# Patient Record
Sex: Female | Born: 1975
Health system: Southern US, Community
[De-identification: ages and names within clinical notes are randomized; demographics above are authoritative.]

## PROBLEM LIST (undated history)

## (undated) DIAGNOSIS — E1169 Type 2 diabetes mellitus with other specified complication: Secondary | ICD-10-CM

## (undated) HISTORY — DX: Morbid (severe) obesity due to excess calories: E66.01

## (undated) HISTORY — DX: Type 2 diabetes mellitus with other specified complication: E11.69

---

## 1999-11-14 ENCOUNTER — Other Ambulatory Visit: Admission: RE | Admit: 1999-11-14 | Discharge: 1999-11-14 | Payer: Self-pay | Admitting: Obstetrics and Gynecology

## 2000-06-09 ENCOUNTER — Other Ambulatory Visit: Admission: RE | Admit: 2000-06-09 | Discharge: 2000-06-09 | Payer: Self-pay | Admitting: Obstetrics and Gynecology

## 2000-12-04 ENCOUNTER — Other Ambulatory Visit: Admission: RE | Admit: 2000-12-04 | Discharge: 2000-12-04 | Payer: Self-pay | Admitting: Obstetrics and Gynecology

## 2001-05-21 ENCOUNTER — Encounter: Payer: Self-pay | Admitting: Obstetrics and Gynecology

## 2001-05-21 ENCOUNTER — Encounter: Admission: RE | Admit: 2001-05-21 | Discharge: 2001-05-21 | Payer: Self-pay | Admitting: Obstetrics and Gynecology

## 2003-09-23 ENCOUNTER — Inpatient Hospital Stay (HOSPITAL_COMMUNITY): Admission: RE | Admit: 2003-09-23 | Discharge: 2003-09-23 | Payer: Self-pay | Admitting: Gynecology

## 2003-09-25 ENCOUNTER — Inpatient Hospital Stay (HOSPITAL_COMMUNITY): Admission: AD | Admit: 2003-09-25 | Discharge: 2003-09-25 | Payer: Self-pay | Admitting: Gynecology

## 2004-05-14 ENCOUNTER — Other Ambulatory Visit: Admission: RE | Admit: 2004-05-14 | Discharge: 2004-05-14 | Payer: Self-pay | Admitting: Gynecology

## 2004-05-24 ENCOUNTER — Encounter: Admission: RE | Admit: 2004-05-24 | Discharge: 2004-05-24 | Payer: Self-pay | Admitting: Gynecology

## 2004-06-14 ENCOUNTER — Emergency Department: Payer: Self-pay | Admitting: Emergency Medicine

## 2004-07-01 ENCOUNTER — Ambulatory Visit (HOSPITAL_COMMUNITY): Admission: RE | Admit: 2004-07-01 | Discharge: 2004-07-01 | Payer: Self-pay | Admitting: Gynecology

## 2004-07-17 ENCOUNTER — Ambulatory Visit (HOSPITAL_COMMUNITY): Admission: RE | Admit: 2004-07-17 | Discharge: 2004-07-17 | Payer: Self-pay | Admitting: Gynecology

## 2004-07-22 ENCOUNTER — Encounter: Admission: RE | Admit: 2004-07-22 | Discharge: 2004-07-22 | Payer: Self-pay | Admitting: Gynecology

## 2004-09-19 ENCOUNTER — Inpatient Hospital Stay (HOSPITAL_COMMUNITY): Admission: AD | Admit: 2004-09-19 | Discharge: 2004-09-21 | Payer: Self-pay | Admitting: Gynecology

## 2004-09-19 ENCOUNTER — Inpatient Hospital Stay (HOSPITAL_COMMUNITY): Admission: AD | Admit: 2004-09-19 | Discharge: 2004-09-19 | Payer: Self-pay | Admitting: Gynecology

## 2004-09-20 ENCOUNTER — Encounter (INDEPENDENT_AMBULATORY_CARE_PROVIDER_SITE_OTHER): Payer: Self-pay | Admitting: *Deleted

## 2004-10-31 ENCOUNTER — Other Ambulatory Visit: Admission: RE | Admit: 2004-10-31 | Discharge: 2004-10-31 | Payer: Self-pay | Admitting: Gynecology

## 2005-11-03 ENCOUNTER — Other Ambulatory Visit: Admission: RE | Admit: 2005-11-03 | Discharge: 2005-11-03 | Payer: Self-pay | Admitting: Gynecology

## 2007-02-10 ENCOUNTER — Other Ambulatory Visit: Admission: RE | Admit: 2007-02-10 | Discharge: 2007-02-10 | Payer: Self-pay | Admitting: Gynecology

## 2007-04-26 ENCOUNTER — Encounter: Admission: RE | Admit: 2007-04-26 | Discharge: 2007-04-26 | Payer: Self-pay | Admitting: Family Medicine

## 2008-05-11 ENCOUNTER — Encounter: Payer: Self-pay | Admitting: Gynecology

## 2008-05-11 ENCOUNTER — Other Ambulatory Visit: Admission: RE | Admit: 2008-05-11 | Discharge: 2008-05-11 | Payer: Self-pay | Admitting: Gynecology

## 2008-05-11 ENCOUNTER — Ambulatory Visit: Payer: Self-pay | Admitting: Gynecology

## 2009-12-14 ENCOUNTER — Other Ambulatory Visit: Admission: RE | Admit: 2009-12-14 | Discharge: 2009-12-14 | Payer: Self-pay | Admitting: Family Medicine

## 2009-12-17 ENCOUNTER — Ambulatory Visit: Payer: Self-pay | Admitting: Gynecology

## 2009-12-24 ENCOUNTER — Ambulatory Visit: Payer: Self-pay | Admitting: Gynecology

## 2010-02-26 ENCOUNTER — Ambulatory Visit (HOSPITAL_COMMUNITY): Admission: RE | Admit: 2010-02-26 | Discharge: 2010-02-26 | Payer: Self-pay | Admitting: Obstetrics & Gynecology

## 2010-03-27 ENCOUNTER — Ambulatory Visit (HOSPITAL_COMMUNITY): Admission: RE | Admit: 2010-03-27 | Discharge: 2010-03-27 | Payer: Self-pay | Admitting: Obstetrics & Gynecology

## 2010-04-24 ENCOUNTER — Ambulatory Visit (HOSPITAL_COMMUNITY): Admission: RE | Admit: 2010-04-24 | Discharge: 2010-04-24 | Payer: Self-pay | Admitting: Obstetrics & Gynecology

## 2010-05-08 ENCOUNTER — Encounter
Admission: RE | Admit: 2010-05-08 | Discharge: 2010-05-08 | Payer: Self-pay | Source: Home / Self Care | Attending: Obstetrics and Gynecology | Admitting: Obstetrics and Gynecology

## 2010-06-19 ENCOUNTER — Ambulatory Visit (HOSPITAL_COMMUNITY)
Admission: RE | Admit: 2010-06-19 | Discharge: 2010-06-19 | Payer: Self-pay | Source: Home / Self Care | Attending: Obstetrics & Gynecology | Admitting: Obstetrics & Gynecology

## 2010-07-08 ENCOUNTER — Inpatient Hospital Stay (HOSPITAL_COMMUNITY)
Admission: AD | Admit: 2010-07-08 | Discharge: 2010-07-08 | Payer: Self-pay | Source: Home / Self Care | Attending: Obstetrics and Gynecology | Admitting: Obstetrics and Gynecology

## 2010-07-17 ENCOUNTER — Ambulatory Visit (HOSPITAL_COMMUNITY)
Admission: RE | Admit: 2010-07-17 | Discharge: 2010-07-17 | Payer: Self-pay | Source: Home / Self Care | Attending: Obstetrics & Gynecology | Admitting: Obstetrics & Gynecology

## 2010-07-18 ENCOUNTER — Inpatient Hospital Stay (HOSPITAL_COMMUNITY)
Admission: AD | Admit: 2010-07-18 | Discharge: 2010-07-21 | Payer: Self-pay | Source: Home / Self Care | Attending: Obstetrics & Gynecology | Admitting: Obstetrics & Gynecology

## 2010-07-18 LAB — CBC
HCT: 34.6 % — ABNORMAL LOW (ref 36.0–46.0)
Hemoglobin: 11.5 g/dL — ABNORMAL LOW (ref 12.0–15.0)
MCH: 28.3 pg (ref 26.0–34.0)
MCHC: 33.2 g/dL (ref 30.0–36.0)
MCV: 85.2 fL (ref 78.0–100.0)
Platelets: 291 10*3/uL (ref 150–400)
RBC: 4.06 MIL/uL (ref 3.87–5.11)
RDW: 15.2 % (ref 11.5–15.5)
WBC: 11.5 10*3/uL — ABNORMAL HIGH (ref 4.0–10.5)

## 2010-07-18 LAB — COMPREHENSIVE METABOLIC PANEL
ALT: 29 U/L (ref 0–35)
AST: 24 U/L (ref 0–37)
Albumin: 2.7 g/dL — ABNORMAL LOW (ref 3.5–5.2)
Alkaline Phosphatase: 99 U/L (ref 39–117)
BUN: 11 mg/dL (ref 6–23)
CO2: 23 mEq/L (ref 19–32)
Calcium: 9.5 mg/dL (ref 8.4–10.5)
Chloride: 109 mEq/L (ref 96–112)
Creatinine, Ser: 0.73 mg/dL (ref 0.4–1.2)
GFR calc Af Amer: >60 mL/min (ref >60)
GFR calc non Af Amer: >60 mL/min (ref >60)
Glucose, Bld: 63 mg/dL — ABNORMAL LOW (ref 70–99)
Potassium: 4.3 mEq/L (ref 3.5–5.1)
Sodium: 139 mEq/L (ref 135–145)
Total Bilirubin: 0.1 mg/dL — ABNORMAL LOW (ref 0.3–1.2)
Total Protein: 6 g/dL (ref 6.0–8.3)

## 2010-07-19 LAB — RPR: RPR Ser Ql: NONREACTIVE

## 2010-07-29 LAB — CBC
HCT: 30.8 % — ABNORMAL LOW (ref 36.0–46.0)
Hemoglobin: 9.9 g/dL — ABNORMAL LOW (ref 12.0–15.0)
MCH: 28.6 pg (ref 26.0–34.0)
MCHC: 32.1 g/dL (ref 30.0–36.0)
MCV: 89 fL (ref 78.0–100.0)
Platelets: 227 10*3/uL (ref 150–400)
RBC: 3.46 MIL/uL — ABNORMAL LOW (ref 3.87–5.11)
RDW: 15.4 % (ref 11.5–15.5)
WBC: 7.6 10*3/uL (ref 4.0–10.5)

## 2010-07-29 LAB — GLUCOSE, CAPILLARY
Glucose-Capillary: 90 mg/dL (ref 70–99)
Glucose-Capillary: 76 mg/dL (ref 70–99)
Glucose-Capillary: 89 mg/dL (ref 70–99)
Glucose-Capillary: 101 mg/dL — ABNORMAL HIGH (ref 70–99)

## 2010-11-29 NOTE — Consult Note (Signed)
NAME:  Annette Simmons, Annette Simmons                        ACCOUNT NO.:  [id number]   MEDICAL RECORD NO.:  [id number]                   PATIENT TYPE:  OUT   LOCATION:  [facility]                                 FACILITY:  [facility]   PHYSICIAN:  Ivor Costa. Farrel Gobble, M.D.              DATE OF BIRTH:  1975-09-24   DATE OF CONSULTATION:  09/23/2003  DATE OF DISCHARGE:                                   CONSULTATION   BRIEF HISTORY:  The patient is a 35 year old G2, P0-0-1-0 with an LMP of  August 08, 2003; estimated gestational age was 6-3/7 weeks.  She called  earlier this morning complaining of approximately one-week history of  vaginal bleeding, noted mostly when she wiped.  There was mild cramping, but  nothing consistent with her period and no flow consistent with her period  either.  The patient denies any pelvic pain.  She states that the bleeding  is mostly mucoid and no heavy bleeding, heavy cramping and no clots.   OBSTETRICAL/GYNECOLOGICAL HISTORY:  Negative for sexually transmitted  diseases.  She had one pregnancy approximately 12 years ago that ended in a  miscarriage as well.  They were attempting pregnancy for approximately one  year, having been on birth control pills prior to this point.  The patient  states that her period had regulated to monthly prior to her conception.  The patient reports no other GYN history.  She has had Pap smears and they  have all been normal.   REASON FOR CONSULTATION:  We had asked the patient to come in for a GYN  ultrasound because of her above history and a suspected nonviable pregnancy.  Her ultrasound showed no intrauterine pregnancy.  There was a right adnexal  mass inferior to the ovary, with some internal blood flow that measured 2.8  x 2.1 x 1.7 cm.  Differential included an ovarian mass versus an ectopic  pregnancy.  There was a small amount of free fluid.  Based on that, the  patient was asked to come to Triage.   TRIAGE COURSE:  She was afebrile.  Her  vitals were stable.  The patient  reports some more abdominal pressure, left greater right.   PHYSICAL EXAMINATION:  GENERAL:  On examination she is a well-appearing  female in no acute distress.  ABDOMEN:  Obese, soft and nontender.  PELVIC:  On speculum examination her cervix was nulliparous.  There was a  small amount of blood in the vault that was slightly mucoid, but no gross  active bleeding.  A manual examination was somewhat limited by habitus.  There was no cervical motion tenderness.  Uterus and adnexa were also  negative.  However, because of her habitus a rectovaginal examination was  performed.  The posterior uterus was also nontender, as were the adnexa.  The fullness was not appreciated.   ASSESSMENT:  Missed AB with corpus luteum versus ectopic pregnancy, without  a gestation.  The ultrasound findings were discussed with the patient.  We  discussed treating her pre-emptively with methotrexate.  The risks and  benefits of that were discussed with her and acceptable.  We then elected to  get the methotrexate labs; at this point her CBC is all that is back.  Her  white count is 7.0.  H&H is 12.9 and 83.  Her platelets are 391.  Her  chemistry panel beta HCG are not available at the time of this dictation.  She is 0 positive.   We will triage her once we have received the full extent of her labs.  We,  again, discussed the benefits of methotrexate therapy.  All questions were  addressed.                                               Ivor Costa. Farrel Gobble, M.D.    THL/MEDQ  D:  09/23/2003  T:  09/23/2003  Job:  [id number]

## 2013-03-11 ENCOUNTER — Encounter: Payer: Self-pay | Admitting: Podiatry

## 2013-03-11 ENCOUNTER — Ambulatory Visit (INDEPENDENT_AMBULATORY_CARE_PROVIDER_SITE_OTHER): Payer: 59 | Admitting: Podiatry

## 2013-03-11 DIAGNOSIS — M21969 Unspecified acquired deformity of unspecified lower leg: Secondary | ICD-10-CM

## 2013-03-11 DIAGNOSIS — M21962 Unspecified acquired deformity of left lower leg: Secondary | ICD-10-CM

## 2013-03-11 DIAGNOSIS — M722 Plantar fascial fibromatosis: Secondary | ICD-10-CM | POA: Insufficient documentation

## 2013-03-11 MED ORDER — DEXAMETHASONE SODIUM PHOSPHATE 10 MG/ML IJ SOLN: 4.0000 mg | Freq: Once | INTRAMUSCULAR | Status: AC

## 2013-03-11 MED ORDER — TRIAMCINOLONE ACETONIDE 40 MG/ML IJ SUSP: 10.0000 mg | Freq: Once | INTRAMUSCULAR | Status: AC

## 2013-03-11 NOTE — Progress Notes (Signed)
Subjective: 37 year old female presents with painful heel. Last seen 2 years ago for the pain on lateral column of left foot. Now the pain is more towards heel, plantar lateral. Been hurting for 3-4 months. Wearing Orthotics, which helps on the previous pain, lateral metatarsal pain. Barefooted makes more pain. Pain is shooting and stabbing type pain. Tries to walk, but problem with the pain. When it gets bad, the pain level is at about 8. Sometimes bare can walk on.  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 Takes a few minutes to get adjusted. Making legs hurt now.  Objective: Cavus type rigid foot bilateral. HAV with bunion L>R. Elevated first ray bilateral L>R. Left lateral heel pain.   Tx. Injection to left heel given with 4 mg Dexamethasone and 10 mg Triamcinolone.  Night Splint for right dispensed. May need orthotics and/more Lapidus fusion. Patient will return for either new orthotics or for surgery.  Will need X-rays on her next visit.

## 2013-04-11 ENCOUNTER — Encounter: Payer: Self-pay | Admitting: Podiatry

## 2013-04-11 ENCOUNTER — Ambulatory Visit (INDEPENDENT_AMBULATORY_CARE_PROVIDER_SITE_OTHER): Payer: 59 | Admitting: Podiatry

## 2013-04-11 VITALS — BP 133/86 | HR 95 | Ht 67.0 in | Wt 360.0 lb

## 2013-04-11 DIAGNOSIS — M21969 Unspecified acquired deformity of unspecified lower leg: Secondary | ICD-10-CM

## 2013-04-11 DIAGNOSIS — M21962 Unspecified acquired deformity of left lower leg: Secondary | ICD-10-CM

## 2013-04-11 DIAGNOSIS — M722 Plantar fascial fibromatosis: Secondary | ICD-10-CM

## 2013-04-11 NOTE — Patient Instructions (Addendum)
Seen for left foot pain. Pre-op discussion done for Endoscopic Plantar fasciotomy left.  Also given Cortisone injection to left heel. Surgery will be scheduled for 04/28/13.

## 2013-04-11 NOTE — Progress Notes (Signed)
Subjective: 37 year old female presents stating that she had good relief for 2 weeks with last injection. Today wants to discuss surgical option on left foot pain. She has two children age 40 and 3.  Patient is 5'7" tall and weigh 360 lbs.   Objective: Cavus type foot with hypermobile first ray left>right. Hallux valgus with bunion left>right. Plantar heel pain left. Normal neurovascular status.   Assessment: Plantar fasciitis left. Hypermobile first ray left. Hallux valgus with bunion left.   Plan: Injection to left heel with 1cc 1% Xylocaine plain and 10mg  Triamcinolone and 5mg  dexamethasone. Both Orthotics adjusted with lateral posting with 1/8" in heel.  EPF left heel discussed. Will be scheduled for 04/28/13. If this fail to improve, may need Lapidus fusion in future.

## 2013-04-28 DIAGNOSIS — M722 Plantar fascial fibromatosis: Secondary | ICD-10-CM

## 2013-04-28 HISTORY — PX: OTHER SURGICAL HISTORY: SHX169

## 2013-05-03 ENCOUNTER — Encounter: Payer: Self-pay | Admitting: Podiatry

## 2013-05-03 ENCOUNTER — Ambulatory Visit (INDEPENDENT_AMBULATORY_CARE_PROVIDER_SITE_OTHER): Payer: 59 | Admitting: Podiatry

## 2013-05-03 VITALS — BP 129/83 | HR 84

## 2013-05-03 DIAGNOSIS — M722 Plantar fascial fibromatosis: Secondary | ICD-10-CM

## 2013-05-03 NOTE — Patient Instructions (Signed)
Post op visit. Left foot splint removed and replaced with CAM walker.  May remove bandage this Friday and wash. Return in 2 weeks.

## 2013-05-03 NOTE — Progress Notes (Signed)
Post op visit. One week status post EPF left. Stated that pain medication made her sick and not taking it. Walking well with Posterior Splint. No having much pain.  Splint removed.  Normal color and bandage intact. Placed back in CAM walker. Ok to wash this Friday.  Return in 2 weeks.

## 2013-05-13 ENCOUNTER — Ambulatory Visit (INDEPENDENT_AMBULATORY_CARE_PROVIDER_SITE_OTHER): Payer: 59 | Admitting: Podiatry

## 2013-05-13 ENCOUNTER — Encounter: Payer: Self-pay | Admitting: Podiatry

## 2013-05-13 DIAGNOSIS — Z9889 Other specified postprocedural states: Secondary | ICD-10-CM | POA: Insufficient documentation

## 2013-05-13 NOTE — Patient Instructions (Signed)
2 weeks post op from EPF. Doing well with CAM walker. Continue in CAM walker for the next 2 weeks. Increase activity as tolerated. 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 Return in 3 week.

## 2013-05-13 NOTE — Progress Notes (Signed)
2 weeks post op from EPF.  Some pain in arch and foot with barefoot. Tender at the heel.  Doing well with CAM walker. Wound is healing well. Suture site is closed off and dry.  Sutures removed.  Continue in CAM walker for the next 2 weeks. Increase activity as tolerated.  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 Return in 3 week.

## 2013-05-25 ENCOUNTER — Telehealth: Payer: Self-pay | Admitting: *Deleted

## 2013-05-25 NOTE — Telephone Encounter (Signed)
05/25/2013 Dr. Raynald Kemp ,Patient called today and has a couple questions. She is still wearing Camwalker but she is having some pain. Does she need to still keep foot elevated, also when she props foot up it isn't straight, she wants to know anything she should do until she comes for her appointment next Friday.

## 2013-06-03 ENCOUNTER — Encounter: Payer: Self-pay | Admitting: Podiatry

## 2013-06-03 ENCOUNTER — Ambulatory Visit (INDEPENDENT_AMBULATORY_CARE_PROVIDER_SITE_OTHER): Payer: 59 | Admitting: Podiatry

## 2013-06-03 DIAGNOSIS — Z9889 Other specified postprocedural states: Secondary | ICD-10-CM

## 2013-06-03 NOTE — Progress Notes (Signed)
Stated that she started to using her regular shoes. Only has some minor discomfort at lateral column of the left foot.  Status post EPF left 5 weeks.   Objective: Hallux valgus with bunion left. Elevated first ray with forefoot varus.  Assessment: Satisfactory progress. Hallux valgus with bunion. Elevated first ray with abnormal biomechanics.  Plan: Reviewed findings. Metatarsal pad may benefit. Dispensed x 1. May also use lateral wedge posting to existing orthotics. Patient is able to do it by herself.  Return as needed.

## 2013-06-03 NOTE — Patient Instructions (Signed)
Status post EPF, doing well. Continue to use Orthotics and return as needed.

## 2014-04-14 ENCOUNTER — Other Ambulatory Visit: Payer: Self-pay | Admitting: Internal Medicine

## 2014-05-02 ENCOUNTER — Other Ambulatory Visit (HOSPITAL_COMMUNITY)
Admission: RE | Admit: 2014-05-02 | Discharge: 2014-05-02 | Disposition: A | Discharge status: Home / Self Care | Payer: 59 | Source: Ambulatory Visit | Attending: Family Medicine | Admitting: Family Medicine

## 2014-05-02 ENCOUNTER — Other Ambulatory Visit: Payer: Self-pay | Admitting: Family Medicine

## 2014-05-02 DIAGNOSIS — Z113 Encounter for screening for infections with a predominantly sexual mode of transmission: Secondary | ICD-10-CM | POA: Diagnosis present

## 2014-05-02 DIAGNOSIS — Z124 Encounter for screening for malignant neoplasm of cervix: Secondary | ICD-10-CM | POA: Diagnosis not present

## 2014-05-04 LAB — CYTOLOGY - PAP

## 2014-05-05 ENCOUNTER — Other Ambulatory Visit: Payer: Self-pay | Admitting: Family Medicine

## 2014-05-05 DIAGNOSIS — T8332XA Displacement of intrauterine contraceptive device, initial encounter: Secondary | ICD-10-CM

## 2014-05-05 DIAGNOSIS — T8389XA Other specified complication of genitourinary prosthetic devices, implants and grafts, initial encounter: Principal | ICD-10-CM

## 2014-05-08 ENCOUNTER — Ambulatory Visit
Admission: RE | Admit: 2014-05-08 | Discharge: 2014-05-08 | Disposition: A | Discharge status: Home / Self Care | Payer: 59 | Source: Ambulatory Visit | Attending: Family Medicine | Admitting: Family Medicine

## 2014-05-08 DIAGNOSIS — T8332XA Displacement of intrauterine contraceptive device, initial encounter: Secondary | ICD-10-CM

## 2014-05-08 DIAGNOSIS — T8389XA Other specified complication of genitourinary prosthetic devices, implants and grafts, initial encounter: Principal | ICD-10-CM

## 2014-06-28 ENCOUNTER — Telehealth: Payer: 59 | Admitting: Family

## 2014-06-28 DIAGNOSIS — J019 Acute sinusitis, unspecified: Secondary | ICD-10-CM

## 2014-06-28 MED ORDER — AMOXICILLIN-POT CLAVULANATE 875-125 MG PO TABS: 1.0000 | ORAL_TABLET | Freq: Two times a day (BID) | ORAL | Status: DC

## 2014-06-28 NOTE — Progress Notes (Signed)

## 2016-01-26 IMAGING — US US PELVIS COMPLETE
1 series · 13 of 25 positions shown · non-contrast
Comparison: None

CLINICAL DATA: Evaluate for intrauterine device migration



[Series 1: us pelvis complete · 0.41mm/px · 13 of 58 slices shown]
[im 1/58]
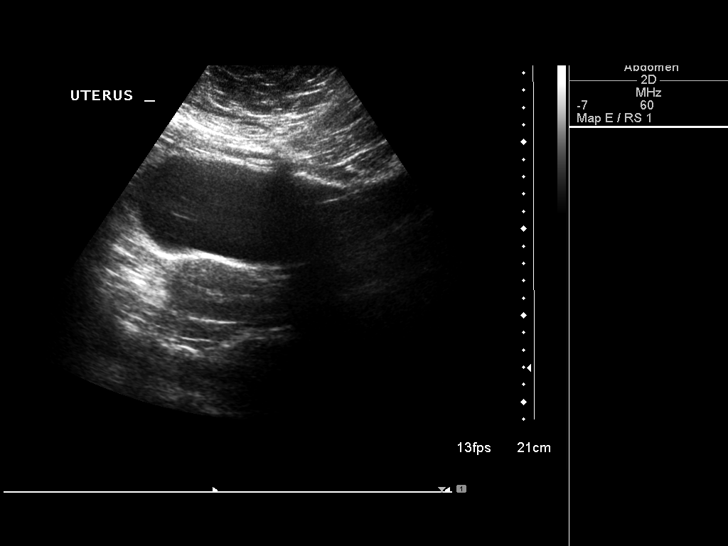
[im 5/58]
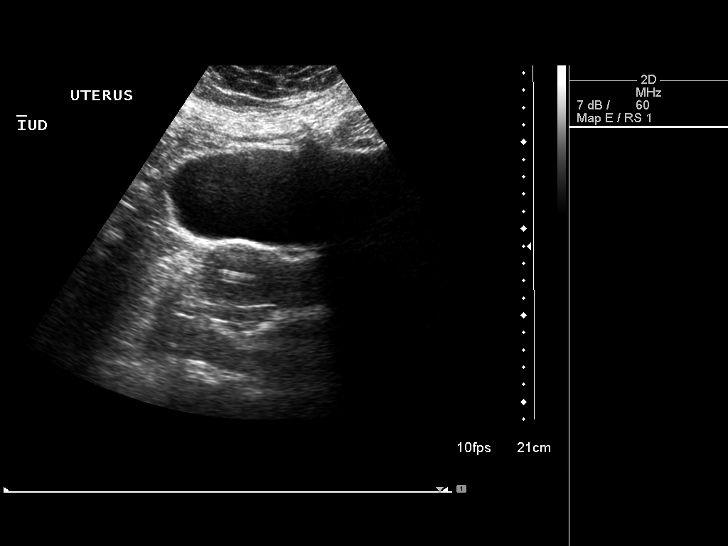
[im 10/58]
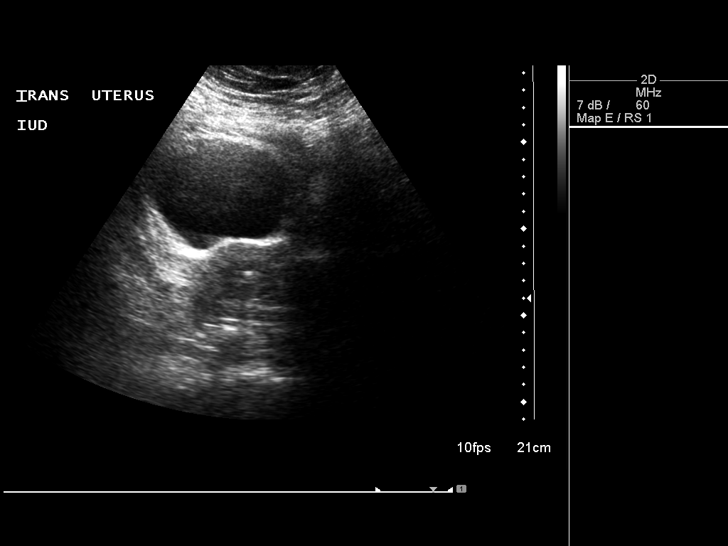
[im 15/58]
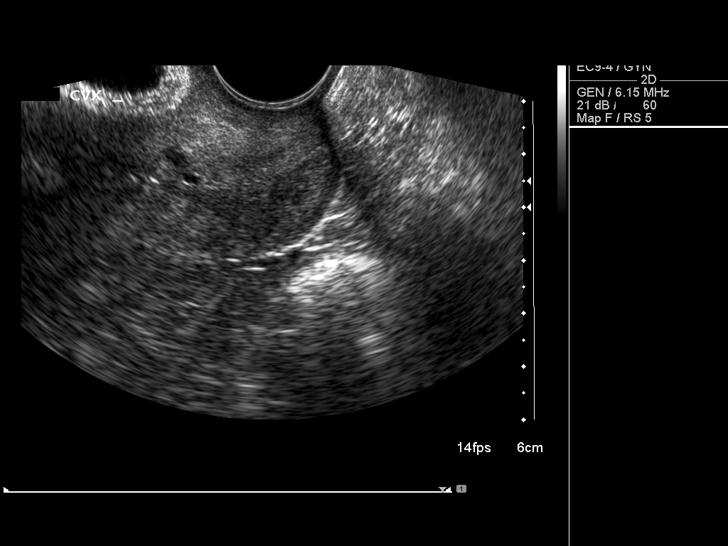
[im 20/58]
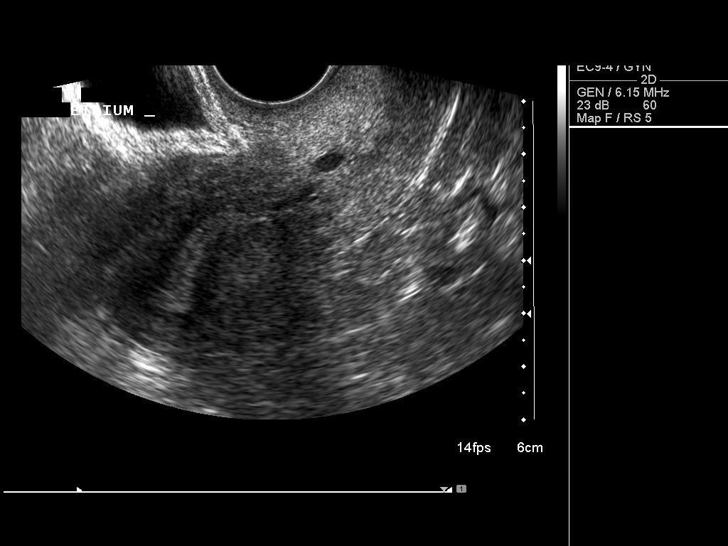
[im 24/58]
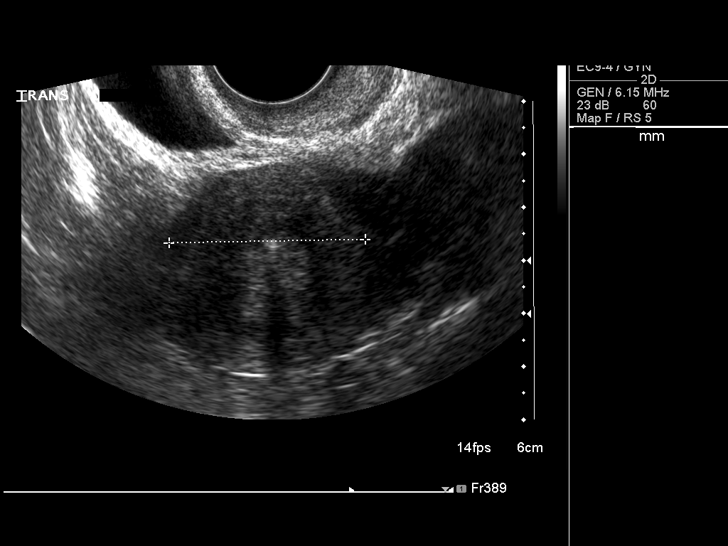
[im 29/58]
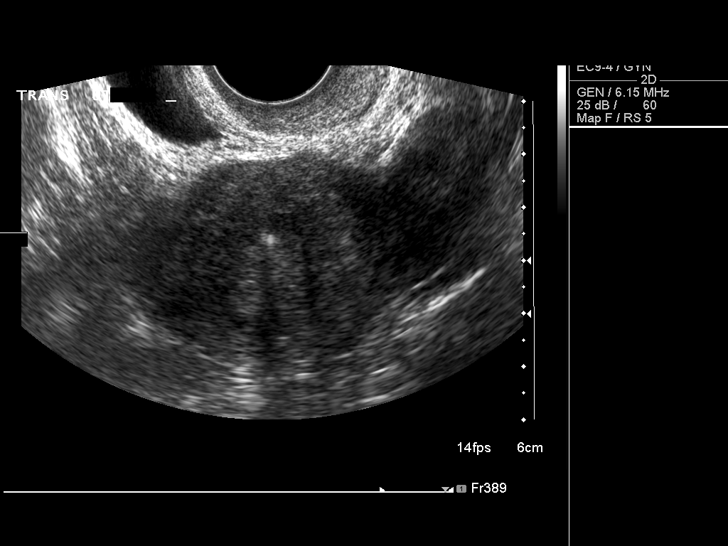
[im 34/58]
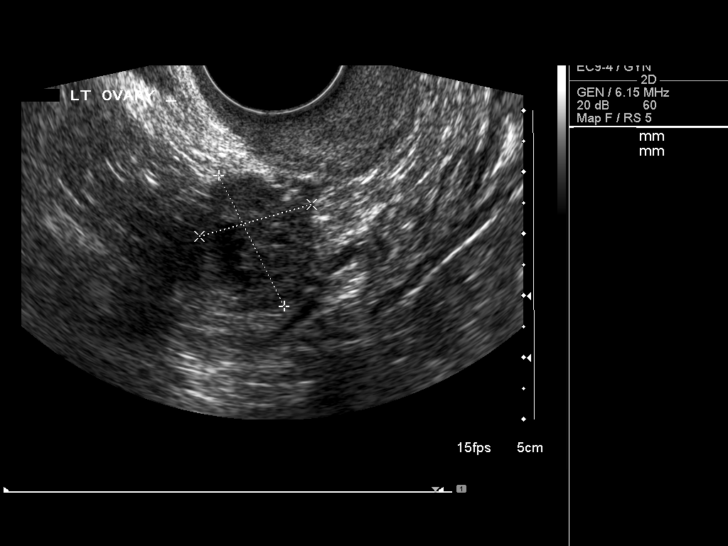
[im 39/58]
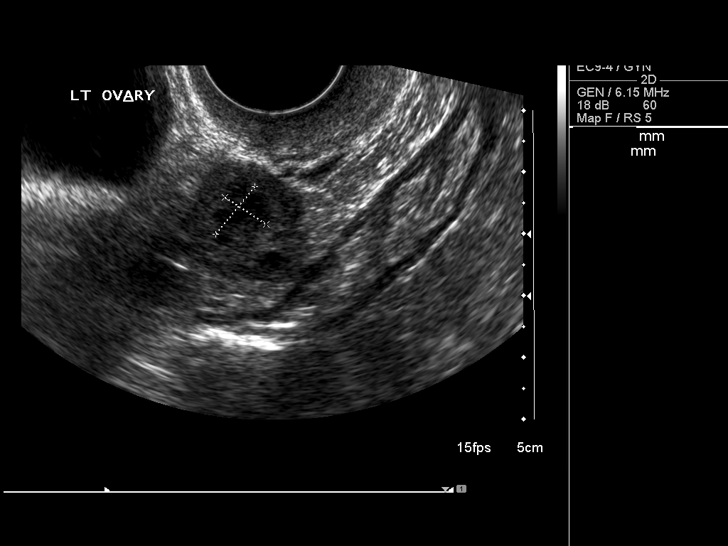
[im 43/58]
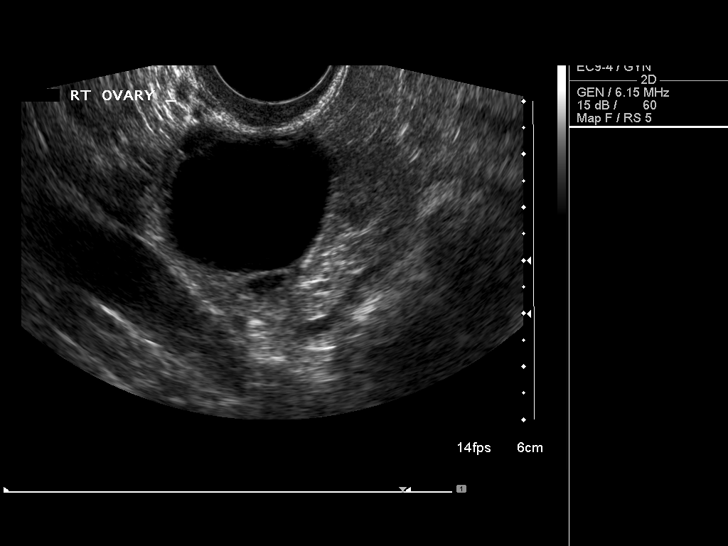
[im 48/58]
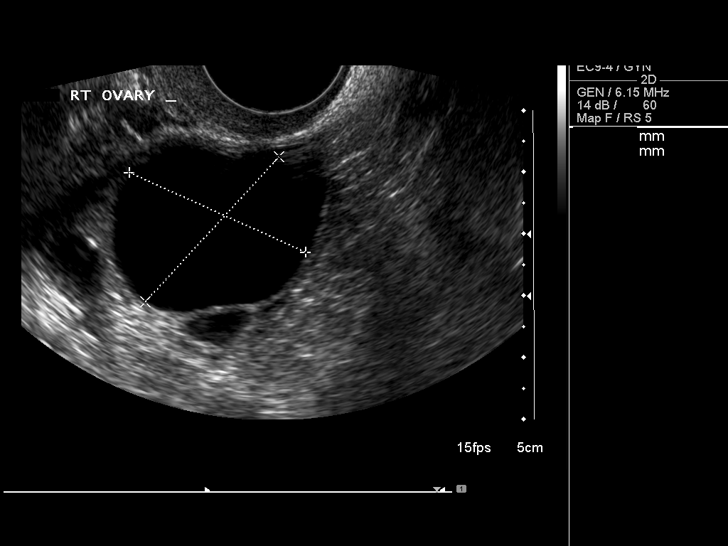
[im 53/58]
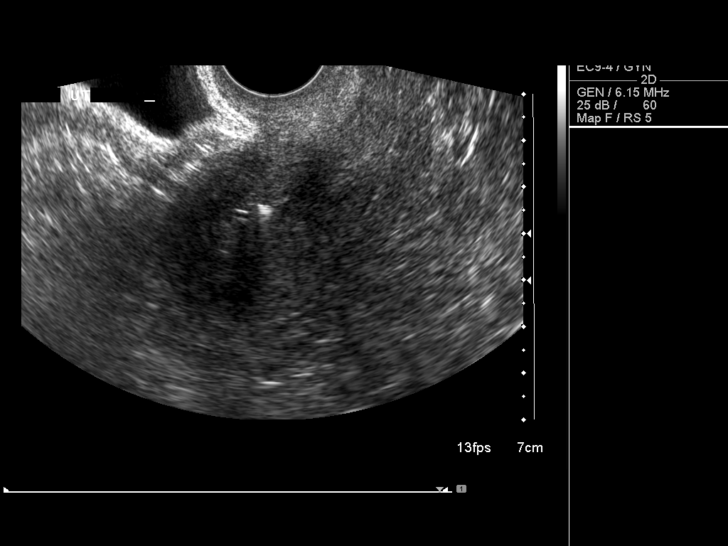
[im 58/58]
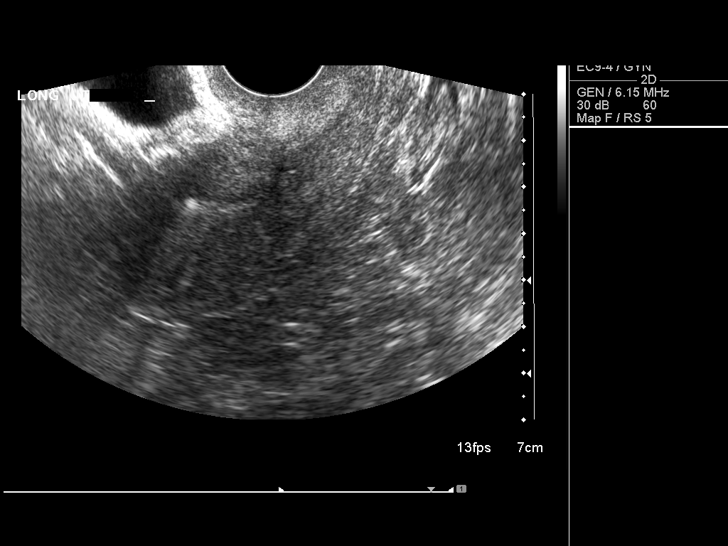

[13 of 25 positions shown; findings below may reference images not displayed]

FINDINGS: Uterus

Measurements: 8.9 x 3.6 x 5.1 cm. No fibroids or other mass
visualized.

Endometrium

Thickness: 7 mm. Intrauterine device is present and visualized
extending to the level of the uterine fundus. Exact location is
difficult to determine given somewhat poor visualization secondary
to patient's body habitus however there is no definite gross
abnormality.

Right ovary

Measurements: 4.2 x 3.8 x 3.4 cm. There is a 3.1 x 3.2 x 2.6 cm
cyst.

Left ovary

Measurements: 2.4 x 1.9 x 2.0 cm. There is a 1.8 x 0.9 x 0.8 cm
hypoechoic lesion with internal echoes within the left ovary.

Other findings

No free fluid.
IMPRESSION: Evaluation of the endometrium and intrauterine device location is
limited secondary to body habitus. The IUD is visualized extending
to the level of the uterine fundus.

Probable hemorrhagic cyst left ovary. Additional consideration is
resolving corpus luteum. Short-interval follow up ultrasound in 6-12
weeks is recommended, preferably during the week following the
patient's normal menses.

## 2017-03-25 DIAGNOSIS — Z1231 Encounter for screening mammogram for malignant neoplasm of breast: Secondary | ICD-10-CM | POA: Diagnosis not present

## 2017-03-25 DIAGNOSIS — Z124 Encounter for screening for malignant neoplasm of cervix: Secondary | ICD-10-CM | POA: Diagnosis not present

## 2017-08-31 DIAGNOSIS — E785 Hyperlipidemia, unspecified: Secondary | ICD-10-CM | POA: Diagnosis not present

## 2017-08-31 DIAGNOSIS — E559 Vitamin D deficiency, unspecified: Secondary | ICD-10-CM | POA: Diagnosis not present

## 2017-08-31 DIAGNOSIS — R7303 Prediabetes: Secondary | ICD-10-CM | POA: Diagnosis not present

## 2017-08-31 DIAGNOSIS — Z79899 Other long term (current) drug therapy: Secondary | ICD-10-CM | POA: Diagnosis not present

## 2017-09-02 ENCOUNTER — Ambulatory Visit
Admission: RE | Admit: 2017-09-02 | Discharge: 2017-09-02 | Disposition: A | Discharge status: Home / Self Care | Payer: 59 | Source: Ambulatory Visit | Attending: Family Medicine | Admitting: Family Medicine

## 2017-09-02 ENCOUNTER — Other Ambulatory Visit: Payer: Self-pay | Admitting: Family Medicine

## 2017-09-02 DIAGNOSIS — Z0000 Encounter for general adult medical examination without abnormal findings: Secondary | ICD-10-CM | POA: Diagnosis not present

## 2017-09-02 DIAGNOSIS — R062 Wheezing: Secondary | ICD-10-CM

## 2018-01-09 DIAGNOSIS — J0100 Acute maxillary sinusitis, unspecified: Secondary | ICD-10-CM | POA: Diagnosis not present

## 2018-05-14 DIAGNOSIS — H9209 Otalgia, unspecified ear: Secondary | ICD-10-CM | POA: Diagnosis not present

## 2018-05-14 DIAGNOSIS — H60509 Unspecified acute noninfective otitis externa, unspecified ear: Secondary | ICD-10-CM | POA: Diagnosis not present

## 2018-05-14 DIAGNOSIS — H60549 Acute eczematoid otitis externa, unspecified ear: Secondary | ICD-10-CM | POA: Diagnosis not present

## 2018-07-05 DIAGNOSIS — H43812 Vitreous degeneration, left eye: Secondary | ICD-10-CM | POA: Diagnosis not present

## 2018-07-05 DIAGNOSIS — H524 Presbyopia: Secondary | ICD-10-CM | POA: Diagnosis not present

## 2018-08-24 DIAGNOSIS — R05 Cough: Secondary | ICD-10-CM | POA: Diagnosis not present

## 2018-08-24 DIAGNOSIS — J329 Chronic sinusitis, unspecified: Secondary | ICD-10-CM | POA: Diagnosis not present

## 2020-03-28 ENCOUNTER — Other Ambulatory Visit: Payer: Self-pay | Admitting: Physician Assistant

## 2020-03-28 ENCOUNTER — Encounter: Payer: Self-pay | Admitting: Physician Assistant

## 2020-03-28 ENCOUNTER — Ambulatory Visit (HOSPITAL_COMMUNITY)
Admission: RE | Admit: 2020-03-28 | Discharge: 2020-03-28 | Disposition: A | Discharge status: Home / Self Care | Payer: 59 | Source: Ambulatory Visit | Attending: Pulmonary Disease | Admitting: Pulmonary Disease

## 2020-03-28 DIAGNOSIS — E669 Obesity, unspecified: Secondary | ICD-10-CM | POA: Insufficient documentation

## 2020-03-28 DIAGNOSIS — U071 COVID-19: Secondary | ICD-10-CM

## 2020-03-28 DIAGNOSIS — E1169 Type 2 diabetes mellitus with other specified complication: Secondary | ICD-10-CM

## 2020-03-28 MED ORDER — SODIUM CHLORIDE 0.9 % IV SOLN
1200.0000 mg | Freq: Once | INTRAVENOUS | Status: AC
  Administered 2020-03-28: 1200 mg via INTRAVENOUS

## 2020-03-28 MED ORDER — METHYLPREDNISOLONE SODIUM SUCC 125 MG IJ SOLR: 125.0000 mg | Freq: Once | INTRAMUSCULAR | Status: DC | PRN

## 2020-03-28 MED ORDER — SODIUM CHLORIDE 0.9 % IV SOLN: INTRAVENOUS | Status: DC | PRN

## 2020-03-28 MED ORDER — ALBUTEROL SULFATE HFA 108 (90 BASE) MCG/ACT IN AERS: 2.0000 | INHALATION_SPRAY | Freq: Once | RESPIRATORY_TRACT | Status: DC | PRN

## 2020-03-28 MED ORDER — FAMOTIDINE IN NACL 20-0.9 MG/50ML-% IV SOLN: 20.0000 mg | Freq: Once | INTRAVENOUS | Status: DC | PRN

## 2020-03-28 MED ORDER — EPINEPHRINE 0.3 MG/0.3ML IJ SOAJ: 0.3000 mg | Freq: Once | INTRAMUSCULAR | Status: DC | PRN

## 2020-03-28 MED ORDER — DIPHENHYDRAMINE HCL 50 MG/ML IJ SOLN: 50.0000 mg | Freq: Once | INTRAMUSCULAR | Status: DC | PRN

## 2020-03-28 NOTE — Progress Notes (Signed)
I connected by phone with Annette Simmons on 03/28/2020 at 4:05 PM to discuss the potential use of a new treatment for mild to moderate COVID-19 viral infection in non-hospitalized patients.  This patient is a 44 y.o. female that meets the FDA criteria for Emergency Use Authorization of COVID monoclonal antibody casirivimab/imdevimab.  Has a (+) direct SARS-CoV-2 viral test result  Has mild or moderate COVID-19   Is NOT hospitalized due to COVID-19  Is within 10 days of symptom onset  Has at least one of the high risk factor(s) for progression to severe COVID-19 and/or hospitalization as defined in EUA.  Specific high risk criteria : BMI > 25 and Diabetes   I have spoken and communicated the following to the patient or parent/caregiver regarding COVID monoclonal antibody treatment:  1. FDA has authorized the emergency use for the treatment of mild to moderate COVID-19 in adults and pediatric patients with positive results of direct SARS-CoV-2 viral testing who are 61 years of age and older weighing at least 40 kg, and who are at high risk for progressing to severe COVID-19 and/or hospitalization.  2. The significant known and potential risks and benefits of COVID monoclonal antibody, and the extent to which such potential risks and benefits are unknown.  3. Information on available alternative treatments and the risks and benefits of those alternatives, including clinical trials.  4. Patients treated with COVID monoclonal antibody should continue to self-isolate and use infection control measures (e.g., wear mask, isolate, social distance, avoid sharing personal items, clean and disinfect "high touch" surfaces, and frequent handwashing) according to CDC guidelines.   5. The patient or parent/caregiver has the option to accept or refuse COVID monoclonal antibody treatment.  After reviewing this information with the patient, The patient agreed to proceed with receiving  casirivimab\imdevimab infusion and will be provided a copy of the Fact sheet prior to receiving the infusion.  Sx onset 9/11. Set up for infusion on 9/15 @ 4:00pm. Pt is aware that insurance will be charged an infusion fee. Pt is unvaccinated.   [name] 03/28/2020 4:05 PM

## 2020-03-28 NOTE — Discharge Instructions (Signed)

## 2021-11-19 ENCOUNTER — Other Ambulatory Visit: Payer: Self-pay | Admitting: Gastroenterology

## 2022-03-24 ENCOUNTER — Other Ambulatory Visit: Payer: Self-pay | Admitting: Gastroenterology

## 2022-03-25 ENCOUNTER — Ambulatory Visit (HOSPITAL_COMMUNITY): Admission: RE | Admit: 2022-03-25 | Payer: 59 | Source: Home / Self Care | Admitting: Gastroenterology

## 2022-03-25 ENCOUNTER — Encounter (HOSPITAL_COMMUNITY): Admission: RE | Payer: Self-pay | Source: Home / Self Care

## 2022-03-25 SURGERY — COLONOSCOPY WITH PROPOFOL
Anesthesia: Monitor Anesthesia Care

## 2022-04-23 ENCOUNTER — Other Ambulatory Visit (HOSPITAL_COMMUNITY)
Admission: RE | Admit: 2022-04-23 | Discharge: 2022-04-23 | Disposition: A | Discharge status: Home / Self Care | Payer: 59 | Source: Ambulatory Visit | Attending: Family Medicine | Admitting: Family Medicine

## 2022-04-23 ENCOUNTER — Other Ambulatory Visit: Payer: Self-pay | Admitting: Family Medicine

## 2022-04-23 DIAGNOSIS — Z01411 Encounter for gynecological examination (general) (routine) with abnormal findings: Secondary | ICD-10-CM | POA: Diagnosis not present

## 2022-04-25 LAB — CYTOLOGY - PAP
Comment: NEGATIVE
Diagnosis: NEGATIVE
High risk HPV: NEGATIVE

## 2022-05-07 ENCOUNTER — Encounter (HOSPITAL_COMMUNITY): Payer: Self-pay | Admitting: Gastroenterology

## 2022-05-08 NOTE — Progress Notes (Signed)
Attempted to obtain medical history via telephone, unable to reach at this time. HIPAA compliant voicemail message left requesting return call to pre surgical testing department. 

## 2022-05-14 ENCOUNTER — Ambulatory Visit (HOSPITAL_COMMUNITY): Admission: RE | Admit: 2022-05-14 | Payer: 59 | Source: Home / Self Care | Admitting: Gastroenterology

## 2022-05-14 ENCOUNTER — Encounter (HOSPITAL_COMMUNITY): Admission: RE | Payer: Self-pay | Source: Home / Self Care

## 2022-05-14 SURGERY — COLONOSCOPY WITH PROPOFOL
Anesthesia: Monitor Anesthesia Care

## 2022-06-11 ENCOUNTER — Other Ambulatory Visit: Payer: Self-pay | Admitting: Gastroenterology

## 2022-07-04 NOTE — Progress Notes (Signed)
Attempted to obtain medical history. Unable to reach pt. At this time. HIPAA complaint voicemail left with pre-surgical testing number. 

## 2022-07-16 ENCOUNTER — Encounter (HOSPITAL_COMMUNITY)
Admission: RE | Disposition: A | Discharge status: Home Health | Payer: Self-pay | Source: Home / Self Care | Attending: Gastroenterology

## 2022-07-16 ENCOUNTER — Ambulatory Visit (HOSPITAL_COMMUNITY): Payer: 59 | Admitting: Certified Registered Nurse Anesthetist

## 2022-07-16 ENCOUNTER — Encounter (HOSPITAL_COMMUNITY): Payer: Self-pay | Admitting: Gastroenterology

## 2022-07-16 ENCOUNTER — Ambulatory Visit (HOSPITAL_BASED_OUTPATIENT_CLINIC_OR_DEPARTMENT_OTHER): Payer: 59 | Admitting: Certified Registered Nurse Anesthetist

## 2022-07-16 ENCOUNTER — Other Ambulatory Visit: Payer: Self-pay

## 2022-07-16 ENCOUNTER — Ambulatory Visit (HOSPITAL_COMMUNITY)
Admission: RE | Admit: 2022-07-16 | Discharge: 2022-07-16 | Disposition: A | Discharge status: Home Health | Payer: 59 | Attending: Gastroenterology | Admitting: Gastroenterology

## 2022-07-16 DIAGNOSIS — Z1211 Encounter for screening for malignant neoplasm of colon: Secondary | ICD-10-CM | POA: Diagnosis not present

## 2022-07-16 DIAGNOSIS — E119 Type 2 diabetes mellitus without complications: Secondary | ICD-10-CM | POA: Insufficient documentation

## 2022-07-16 DIAGNOSIS — Z83719 Family history of colon polyps, unspecified: Secondary | ICD-10-CM | POA: Diagnosis not present

## 2022-07-16 DIAGNOSIS — K5730 Diverticulosis of large intestine without perforation or abscess without bleeding: Secondary | ICD-10-CM | POA: Diagnosis not present

## 2022-07-16 DIAGNOSIS — K648 Other hemorrhoids: Secondary | ICD-10-CM | POA: Insufficient documentation

## 2022-07-16 DIAGNOSIS — K5900 Constipation, unspecified: Secondary | ICD-10-CM | POA: Insufficient documentation

## 2022-07-16 DIAGNOSIS — Z7985 Long-term (current) use of injectable non-insulin antidiabetic drugs: Secondary | ICD-10-CM

## 2022-07-16 HISTORY — PX: COLONOSCOPY WITH PROPOFOL: SHX5780

## 2022-07-16 LAB — GLUCOSE, CAPILLARY: Glucose-Capillary: 113 mg/dL — ABNORMAL HIGH (ref 70–99)

## 2022-07-16 SURGERY — COLONOSCOPY WITH PROPOFOL
Anesthesia: Monitor Anesthesia Care | Laterality: Bilateral

## 2022-07-16 MED ORDER — PROPOFOL 1000 MG/100ML IV EMUL
INTRAVENOUS | Status: AC
  Filled 2022-07-16: qty 100

## 2022-07-16 MED ORDER — LACTATED RINGERS IV SOLN: INTRAVENOUS | Status: DC | PRN

## 2022-07-16 MED ORDER — PROPOFOL 500 MG/50ML IV EMUL
INTRAVENOUS | Status: AC
  Filled 2022-07-16: qty 50

## 2022-07-16 MED ORDER — LACTATED RINGERS IV SOLN: INTRAVENOUS | Status: DC

## 2022-07-16 MED ORDER — PROPOFOL 500 MG/50ML IV EMUL
INTRAVENOUS | Status: DC | PRN
  Administered 2022-07-16: 125 ug/kg/min via INTRAVENOUS

## 2022-07-16 MED ORDER — LIDOCAINE 2% (20 MG/ML) 5 ML SYRINGE
INTRAMUSCULAR | Status: DC | PRN
  Administered 2022-07-16: 60 mg via INTRAVENOUS

## 2022-07-16 MED ORDER — SODIUM CHLORIDE 0.9 % IV SOLN: INTRAVENOUS | Status: DC

## 2022-07-16 MED ORDER — PROPOFOL 10 MG/ML IV BOLUS
INTRAVENOUS | Status: DC | PRN
  Administered 2022-07-16: 30 mg via INTRAVENOUS

## 2022-07-16 SURGICAL SUPPLY — 22 items

## 2022-07-16 NOTE — Op Note (Signed)
[facility] Patient Name: Annette Simmons Procedure Date: 07/16/2022 MRN: [id number] Attending MD: Arta Silence , MD, [id number] Date of Birth: [birth year] CSN: [id number] Age: 47 Admit Type: Outpatient Procedure:                Colonoscopy Indications:              Screening for colorectal malignant neoplasm, Colon                            cancer screening in patient at increased risk:                            Family history of 1st-degree relative with colon                            polyps before age 3 years Providers:                Arta Silence, MD, Dulcy Fanny, Darliss Cheney,                            Technician, Dellie Catholic Referring MD:              Medicines:                Monitored Anesthesia Care Complications:            No immediate complications. Estimated Blood Loss:     Estimated blood loss: none. Procedure:                Pre-Anesthesia Assessment:                           - Prior to the procedure, a History and Physical                            was performed, and patient medications and                            allergies were reviewed. The patient's tolerance of                            previous anesthesia was also reviewed. The risks                            and benefits of the procedure and the sedation                            options and risks were discussed with the patient.                            All questions were answered, and informed consent                            was obtained. Prior Anticoagulants: The patient has  taken no anticoagulant or antiplatelet agents                            except for aspirin. ASA Grade Assessment: III - A                            patient with severe systemic disease. After                            reviewing the risks and benefits, the patient was                            deemed in satisfactory condition to undergo the                             procedure.                           After obtaining informed consent, the colonoscope                            was passed under direct vision. Throughout the                            procedure, the patient's blood pressure, pulse, and                            oxygen saturations were monitored continuously. The                            CF-HQ190L ([id number]) Olympus colonoscope was                            introduced through the anus and advanced to the the                            cecum, identified by appendiceal orifice and                            ileocecal valve. The ileocecal valve, appendiceal                            orifice, and rectum were photographed. The entire                            colon was examined. The colonoscopy was performed                            without difficulty. The patient tolerated the                            procedure well. The quality of the bowel  preparation was good. Scope In: 8:52:19 AM Scope Out: 9:12:17 AM Scope Withdrawal Time: 0 hours 9 minutes 6 seconds  Total Procedure Duration: 0 hours 19 minutes 58 seconds  Findings:      The perianal and digital rectal examinations were normal.      Internal hemorrhoids were found during retroflexion. The hemorrhoids       were mild.      A few small-mouthed diverticula were found in the sigmoid colon.      Colon otherwise normal; no other polyps, masses, vascular ectasias, or       inflammatory changes were seen.      No additional abnormalities were found on retroflexion. Impression:               - Internal hemorrhoids.                           - Diverticulosis in the sigmoid colon.                           - No specimens collected.                           - The examination was otherwise normal. Moderate Sedation:      None Recommendation:           - Patient has a contact number available for                            emergencies. The signs and  symptoms of potential                            delayed complications were discussed with the                            patient. Return to normal activities tomorrow.                            Written discharge instructions were provided to the                            patient.                           - Discharge patient to home (via wheelchair).                           - High fiber diet indefinitely.                           - Continue present medications.                           - Repeat colonoscopy in 5 years for screening                            purposes.                           -  Return to GI clinic PRN.                           - Return to referring physician as previously                            scheduled. Procedure Code(s):        --- Professional ---                           R5188, Colorectal cancer screening; colonoscopy on                            individual at high risk Diagnosis Code(s):        --- Professional ---                           Z12.11, Encounter for screening for malignant                            neoplasm of colon                           Z83.71, Family history of colonic polyps                           K64.8, Other hemorrhoids                           K57.30, Diverticulosis of large intestine without                            perforation or abscess without bleeding CPT copyright 2022 American [employer] Association. All rights reserved. The codes documented in this report are preliminary and upon coder review may  be revised to meet current compliance requirements. Arta Silence, MD 07/16/2022 9:29:50 AM This report has been signed electronically. Number of Addenda: 0

## 2022-07-16 NOTE — Anesthesia Postprocedure Evaluation (Signed)
Anesthesia Post Note  Patient: Annette Simmons  Procedure(s) Performed: COLONOSCOPY WITH PROPOFOL (Bilateral)     Patient location during evaluation: Endoscopy Anesthesia Type: MAC Level of consciousness: awake Pain management: pain level controlled Vital Signs Assessment: post-procedure vital signs reviewed and stable Respiratory status: spontaneous breathing Cardiovascular status: stable Postop Assessment: no apparent nausea or vomiting Anesthetic complications: no  No notable events documented.  Last Vitals:  Vitals:   07/16/22 0930 07/16/22 0940  BP: 129/60 117/78  Pulse: 79 73  Resp: 17 14  Temp:    SpO2: 97% 97%    Last Pain:  Vitals:   07/16/22 0940  TempSrc:   PainSc: 0-No pain                 Huston Foley

## 2022-07-16 NOTE — Anesthesia Preprocedure Evaluation (Signed)
Anesthesia Evaluation  Patient identified by MRN, date of birth, ID band Patient awake    Reviewed: Allergy & Precautions, H&P , NPO status , Patient's Chart, lab work & pertinent test results  Airway Mallampati: I       Dental no notable dental hx.    Pulmonary neg pulmonary ROS   Pulmonary exam normal        Cardiovascular negative cardio ROS Normal cardiovascular exam     Neuro/Psych negative neurological ROS  negative psych ROS   GI/Hepatic negative GI ROS, Neg liver ROS,,,  Endo/Other  diabetes  Morbid obesity  Renal/GU negative Renal ROS  negative genitourinary   Musculoskeletal negative musculoskeletal ROS (+)    Abdominal  (+) + obese  Peds negative pediatric ROS (+)  Hematology negative hematology ROS (+)   Anesthesia Other Findings   Reproductive/Obstetrics negative OB ROS                             Anesthesia Physical Anesthesia Plan  ASA: 3  Anesthesia Plan: MAC   Post-op Pain Management:    Induction:   PONV Risk Score and Plan: Propofol infusion and TIVA  Airway Management Planned: Natural Airway, Nasal Cannula and Simple Face Mask  Additional Equipment: None  Intra-op Plan:   Post-operative Plan:   Informed Consent: I have reviewed the patients History and Physical, chart, labs and discussed the procedure including the risks, benefits and alternatives for the proposed anesthesia with the patient or authorized representative who has indicated his/her understanding and acceptance.       Plan Discussed with: CRNA  Anesthesia Plan Comments:        Anesthesia Quick Evaluation

## 2022-07-16 NOTE — Discharge Instructions (Signed)

## 2022-07-16 NOTE — Anesthesia Procedure Notes (Signed)
Procedure Name: MAC Date/Time: 07/16/2022 8:40 AM  Performed by: Claudia Desanctis, CRNAPre-anesthesia Checklist: Patient identified, Emergency Drugs available, Suction available and Patient being monitored Patient Re-evaluated:Patient Re-evaluated prior to induction Oxygen Delivery Method: Simple face mask

## 2022-07-16 NOTE — Transfer of Care (Signed)
Immediate Anesthesia Transfer of Care Note  Patient: Salaya Holtrop  Procedure(s) Performed: COLONOSCOPY WITH PROPOFOL (Bilateral)  Patient Location: Endoscopy Unit  Anesthesia Type:MAC  Level of Consciousness: drowsy  Airway & Oxygen Therapy: Patient Spontanous Breathing and Patient connected to face mask  Post-op Assessment: Report given to RN and Post -op Vital signs reviewed and stable  Post vital signs: Reviewed and stable  Last Vitals:  Vitals Value Taken Time  BP    Temp    Pulse 82 07/16/22 0917  Resp 13 07/16/22 0917  SpO2 97 % 07/16/22 0917  Vitals shown include unvalidated device data.  Last Pain: There were no vitals filed for this visit.       Complications: No notable events documented.

## 2022-07-16 NOTE — H&P (Signed)
[facility] H/P Note  Chief Complaint: Colon cancer screening.  HPI: Annette Simmons is an 47 y.o. female.  First time colonoscopy.  Some intermittent constipation (on mounjaro).  No abdominal pain, blood in stool, unintentional weight loss.  Father with history large colonic polyps requiring colonic resection.  Past Medical History:  Diagnosis Date   Diabetes mellitus type 2 in obese ([place])    Morbid obesity ([place])     Past Surgical History:  Procedure Laterality Date   Plantar Fasciotomy Endoscopy Left 04/28/2013   @ [facility]    Facility-Administered Medications Prior to Admission  Medication Dose Route Frequency Provider Last Rate Last Admin   dexamethasone (DECADRON) injection 4 mg  4 mg Other Once Sheard, Myeong O, DPM       triamcinolone acetonide (KENALOG-40) injection 10 mg  10 mg Intramuscular Once Sheard, Myeong O, DPM       Medications Prior to Admission  Medication Sig Dispense Refill   aspirin EC 81 MG tablet Take 81 mg by mouth at bedtime. Swallow whole.     atorvastatin (LIPITOR) 10 MG tablet Take 10 mg by mouth at bedtime.     cetirizine (ZYRTEC) 10 MG tablet Take 10 mg by mouth at bedtime.     Elderberry 500 MG CAPS Take 500 mg by mouth daily.     montelukast (SINGULAIR) 10 MG tablet Take 10 mg by mouth at bedtime.     MOUNJARO 10 MG/0.5ML Pen Inject 10 mg into the skin once a week.     Multiple Vitamin (MULTIVITAMIN) tablet Take 1 tablet by mouth daily.     sulfamethoxazole-trimethoprim (BACTRIM DS) 800-160 MG tablet Take 1 tablet by mouth every 12 (twelve) hours.     vitamin D3 (CHOLECALCIFEROL) 25 MCG tablet Take 1,000 Units by mouth daily.     albuterol (VENTOLIN HFA) 108 (90 Base) MCG/ACT inhaler Inhale 2 puffs into the lungs every 6 (six) hours as needed (Asthma).     fluticasone (FLONASE) 50 MCG/ACT nasal spray Place 1 spray into both nostrils daily as needed for allergies.     neomycin-polymyxin-hydrocortisone (CORTISPORIN) OTIC solution Place 2 drops  into both ears daily as needed (dry/ scaley  ears).      Allergies:  Allergies  Allergen Reactions   Prozac [Fluoxetine] Nausea And Vomiting    History reviewed. No pertinent family history.  Social History:  reports that she has never smoked. She has never used smokeless tobacco. She reports that she does not currently use alcohol. She reports that she does not use drugs.   ROS: As per HPI, all others negative  There were no vitals taken for this visit. General appearance: Overweight, NAD HEENT:  Anicteric, [place]/AT NECK:  Thick, supple CV:  Regular RESP:  No distress ABD:  Soft, protuberant, non-tender NEURO:  No encephalopathy  Results for orders placed or performed during the hospital encounter of 07/16/22 (from the past 48 hour(s))  Glucose, capillary     Status: Abnormal   Collection Time: 07/16/22  8:21 AM  Result Value Ref Range   Glucose-Capillary 113 (H) 70 - 99 mg/dL    Comment: Glucose reference range applies only to samples taken after fasting for at least 8 hours.   No results found.  Assessment/Plan   Colon cancer screening. Family history colon polyps (father; required colonic resection). Colonoscopy in hospital setting today (BMI > 50). Risks (bleeding, infection, bowel perforation that could require surgery, sedation-related changes in cardiopulmonary systems), benefits (identification and possible treatment of source of  symptoms, exclusion of certain causes of symptoms), and alternatives (watchful waiting, radiographic imaging studies, empiric medical treatment) of colonoscopy were explained to patient/family in detail and patient wishes to proceed.   Landry Dyke 07/16/2022, 8:37 AM

## 2022-07-21 ENCOUNTER — Encounter (HOSPITAL_COMMUNITY): Payer: Self-pay | Admitting: Gastroenterology
